# Patient Record
Sex: Female | Born: 1975 | Race: White | Hispanic: No | Smoking: Former smoker
Health system: Southern US, Community
[De-identification: ages and names within clinical notes are randomized; demographics above are authoritative.]

## PROBLEM LIST (undated history)

## (undated) DIAGNOSIS — K219 Gastro-esophageal reflux disease without esophagitis: Secondary | ICD-10-CM

## (undated) DIAGNOSIS — F419 Anxiety disorder, unspecified: Secondary | ICD-10-CM

## (undated) HISTORY — DX: Anxiety disorder, unspecified: F41.9

## (undated) HISTORY — PX: NO PAST SURGERIES: SHX2092

---

## 2020-06-12 ENCOUNTER — Encounter: Payer: Self-pay | Admitting: Gastroenterology

## 2020-06-29 ENCOUNTER — Ambulatory Visit (INDEPENDENT_AMBULATORY_CARE_PROVIDER_SITE_OTHER): Payer: BC Managed Care – PPO | Admitting: Gastroenterology

## 2020-06-29 ENCOUNTER — Encounter: Payer: Self-pay | Admitting: Gastroenterology

## 2020-06-29 VITALS — BP 112/78 | HR 82 | Ht 64.5 in | Wt 140.0 lb

## 2020-06-29 DIAGNOSIS — Z1212 Encounter for screening for malignant neoplasm of rectum: Secondary | ICD-10-CM | POA: Diagnosis not present

## 2020-06-29 DIAGNOSIS — Z1211 Encounter for screening for malignant neoplasm of colon: Secondary | ICD-10-CM | POA: Diagnosis not present

## 2020-06-29 DIAGNOSIS — R1011 Right upper quadrant pain: Secondary | ICD-10-CM

## 2020-06-29 DIAGNOSIS — R160 Hepatomegaly, not elsewhere classified: Secondary | ICD-10-CM

## 2020-06-29 NOTE — Progress Notes (Addendum)
06/29/2020 Victoria Madden [id number] Jul 17, 1975   HISTORY OF PRESENT ILLNESS: This is a 45 year old female who is new to our office.  She was referred here by Dr. Nyra Capes, her OB/GYN for evaluation of a liver lesion.  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  They stated that this is likely benign but recommended to follow-up with contrast-enhanced MRI.  She tells me that her liver enzymes were checked and were normal.  She tells me that she has been experiencing the RUQ abdominal pain since December.  She says that she constantly has a sensation that there is something in the right upper quadrant, but the pain itself is intermittent.  She says that at most it reaches about a 3 or 4 out of 10 on the pain scale.  She says that she experiences that degree every couple of days.  When it is present it seems to last most of the day.  It does radiate to her right back.  Cannot really identify any triggers.  She has never had colonoscopy in the past.  She denies any rectal bleeding.  She says that she moves her bowels regularly without issues.  No family history of colon cancer.   Past Medical History:  Diagnosis Date  . Anxiety    History reviewed. No pertinent surgical history.  reports that she has quit smoking. She has never used smokeless tobacco. She reports previous alcohol use. She reports that she does not use drugs. family history includes Breast cancer in her sister; Colon polyps in her father; Diabetes in her father; Heart disease in her father, maternal grandfather, and maternal grandmother. No Known Allergies    Outpatient Encounter Medications as of 06/29/2020  Medication Sig  . citalopram (CELEXA) 20 MG tablet Take 20 mg by mouth daily.  Marland Kitchen esomeprazole (NEXIUM) 20 MG capsule Take 20 mg by mouth daily at 12 noon.   No facility-administered  encounter medications on file as of 06/29/2020.     REVIEW OF SYSTEMS  : All other systems reviewed and negative except where noted in the History of Present Illness.   PHYSICAL EXAM: BP 112/78   Pulse 82   Ht 5' 4.5" (1.638 m)   Wt 140 lb (63.5 kg)   LMP 06/11/2020   SpO2 97%   BMI 23.66 kg/m  General: Well developed white female in no acute distress Head: Normocephalic and atraumatic Eyes:  Sclerae anicteric, conjunctiva pink. Ears: Normal auditory acuity Lungs: Clear throughout to auscultation; no W/R/R. Heart: Regular rate and rhythm; no M/R/G. Abdomen: Soft, non-distended.  BS present.  Non-tender. Musculoskeletal: Symmetrical with no gross deformities  Skin: No lesions on visible extremities Extremities: No edema  Neurological: Alert oriented x 4, grossly non-focal Psychological:  Alert and cooperative. Normal mood and affect  ASSESSMENT AND PLAN: *Liver lesion: Ultrasound shows a large complex cystic mass measuring 8.6 centimeters in the right hepatic lobe.  This it is likely benign but they recommended follow-up contrast-enhanced MRI.  We will order that.  We will request lab results of LFTs, etc. from her OB/GYN physician *Right upper quadrant abdominal pain: Intermittent since January.  Unsure if that is related to the liver lesion.  She has a constant sensation that something is present in the right upper quadrant, but pain is intermittent.  Other consideration would be biliary dysfunction, which could be evaluated with a HIDA  scan with CCK if needed.  Will hold off for now. *CRC screening:  Patient prefers to start with Cologuard.  Will order that.  She knows that if it is positive then it will require colonoscopy.  **Lab results received.  Labs were from March 2022.  CBC is normal with a hemoglobin of 14.2 g.  CMP is normal with alk phos 76, AST 22, ALT 18, total bilirubin 0.3.  Results sent for scanning.   CC:  Maryella Shivers, MD

## 2020-06-29 NOTE — Patient Instructions (Addendum)
If you are age 46 or older, your body mass index should be between 23-30. Your Body mass index is 23.66 kg/m. If this is out of the aforementioned range listed, please consider follow up with your Primary Care Provider.  If you are age 49 or younger, your body mass index should be between 19-25. Your Body mass index is 23.66 kg/m. If this is out of the aformentioned range listed, please consider follow up with your Primary Care Provider.   You have been scheduled for an MRI at [facility]  on 07/10/20. Your appointment time is 8am. Please arrive 15 minutes prior to your appointment time for registration purposes. Please make certain not to have anything to eat or drink 6 hours prior to your test. In addition, if you have any metal in your body, have a pacemaker or defibrillator, please be sure to let your ordering physician know. This test typically takes 45 minutes to 1 hour to complete. Should you need to reschedule, please call [phone] to do so.  Your provider has ordered Cologuard testing as an option for colon cancer screening. This is performed by [employer] and may be out of network with your insurance. PRIOR to completing the test, it is YOUR responsibility to contact your insurance about covered benefits for this test. Your out of pocket expense could be anywhere from $0.00 to $649.00.   When you call to check coverage with your insurer, please provide the following information:   -The ONLY provider of Cologuard is Opt[facility], dispensing  - CPT code for Cologuard is [phone].  [facility] NPI # [id number]  -[employer] Tax ID # [id number]   We have already sent your demographic and insurance information to [employer] (phone number [phone]) and they should contact you within the next week regarding your test. If you have not heard from them within the next week, please call our office at [phone].  Due to recent changes in healthcare  laws, you may see the results of your imaging and laboratory studies on MyChart before your provider has had a chance to review them.  We understand that in some cases there may be results that are confusing or concerning to you. Not all laboratory results come back in the same time frame and the provider may be waiting for multiple results in order to interpret others.  Please give [place] 48 hours in order for your provider to thoroughly review all the results before contacting the office for clarification of your results.   The [facility] providers would like to encourage you to use [facility] to communicate with providers for non-urgent requests or questions.  Due to long hold times on the telephone, sending your provider a message by [facility] may be a faster and more efficient way to get a response.  Please allow 48  business hours for a response.  Please remember that this is for non-urgent requests.   Thank you for choosing me and [facility].  Doug Sou, PA-C

## 2020-06-29 NOTE — Progress Notes (Signed)
I agree with the above note, plan.  Ultrasound shows a pretty large cyst in the right lobe of her liver.  We are getting an MRI for further evaluation.  She may need referral to hepatobiliary surgeon to consider cyst resection.

## 2020-07-05 ENCOUNTER — Other Ambulatory Visit: Payer: Self-pay

## 2020-07-05 ENCOUNTER — Telehealth: Payer: Self-pay | Admitting: Gastroenterology

## 2020-07-05 DIAGNOSIS — R160 Hepatomegaly, not elsewhere classified: Secondary | ICD-10-CM

## 2020-07-05 DIAGNOSIS — R1901 Right upper quadrant abdominal swelling, mass and lump: Secondary | ICD-10-CM

## 2020-07-05 NOTE — Telephone Encounter (Signed)
Ginger from [facility] states the order for the MRI for Liver should be changed to with and without contrast

## 2020-07-10 ENCOUNTER — Other Ambulatory Visit: Payer: Self-pay

## 2020-07-10 ENCOUNTER — Ambulatory Visit (HOSPITAL_COMMUNITY)
Admission: RE | Admit: 2020-07-10 | Discharge: 2020-07-10 | Disposition: A | Discharge status: Home / Self Care | Payer: BC Managed Care – PPO | Source: Ambulatory Visit | Attending: Gastroenterology | Admitting: Gastroenterology

## 2020-07-10 DIAGNOSIS — R1901 Right upper quadrant abdominal swelling, mass and lump: Secondary | ICD-10-CM | POA: Diagnosis present

## 2020-07-10 DIAGNOSIS — R160 Hepatomegaly, not elsewhere classified: Secondary | ICD-10-CM | POA: Insufficient documentation

## 2020-07-10 MED ORDER — GADOBUTROL 1 MMOL/ML IV SOLN
6.0000 mL | Freq: Once | INTRAVENOUS | Status: AC | PRN
  Administered 2020-07-10: 6 mL via INTRAVENOUS

## 2020-08-22 DIAGNOSIS — K7689 Other specified diseases of liver: Secondary | ICD-10-CM | POA: Diagnosis present

## 2020-11-07 ENCOUNTER — Ambulatory Visit: Payer: Self-pay | Admitting: Surgery

## 2020-11-07 ENCOUNTER — Other Ambulatory Visit: Payer: Self-pay | Admitting: Surgery

## 2021-01-10 DIAGNOSIS — Z8616 Personal history of COVID-19: Secondary | ICD-10-CM

## 2021-01-10 HISTORY — DX: Personal history of COVID-19: Z86.16

## 2021-02-15 NOTE — Progress Notes (Signed)
Surgical Instructions   Your procedure is scheduled on Thursday, 02/21/2021.  Report to [facility] Main Entrance "A" at 05:30 A.M., then check in with the Admitting office.  Call [phone] if you have problems or questions between now and the morning of surgery:   Remember: Do not eat or drink after midnight the night before your surgery  Take these medicines the morning of surgery with A SIP OF WATER:  Citalopram (Celexa) Esomeprazole (Nexium)   As of today, STOP taking any Aspirin (unless otherwise instructed by your surgeon) or Aspirin-containing products; NSAIDS - Aleve, Naproxen, Ibuprofen, Motrin, Advil, Goody's, BC's, all herbal medications, fish oil, and all vitamins.   After your pre-procedure COVID test  You are not required to quarantine however you are required to wear a well-fitting mask when you are out and around people not in your household.  If your mask becomes wet or soiled, replace with a new one.  Wash your hands often with soap and water for 20 seconds or clean your hands with an alcohol-based hand sanitizer that contains at least 60% alcohol.  Do not share personal items.  Notify your provider: if you are in close contact with someone who has COVID  or if you develop a fever of 100.4 or greater, sneezing, cough, sore throat, shortness of breath or body aches.          The day of surgery: Do not wear jewelry or makeup  Do not wear lotions, powders, perfumes, or deodorant.  Do not shave 48 hours prior to surgery.  Do not wear nail polish, gel polish, artificial nails, or any other type of covering on natural nails including fingernails and toenails. If patients have artificial nails, gel coating, etc. that need to be removed by a nail salon please have this removed prior to surgery or surgery may need to be canceled/delayed if the surgeon/ anesthesia feels like the patient is unable to be adequately monitored.  Do not bring valuables to the hospital -  [facility] is not responsible for any belongings or valuables.  Do NOT Smoke (Tobacco/Vaping) or drink Alcohol 24 hours prior to your procedure  If you use a CPAP at night, please bring your mask for your overnight stay.   Contacts, glasses, hearing aids, dentures or partials may not be worn into surgery, please bring cases for these belongings   For patients admitted to the hospital, discharge time will be determined by your treatment team.   Patients discharged the day of surgery will not be allowed to drive home, and someone needs to stay with them for 24 hours.  NO VISITORS WILL BE ALLOWED IN PRE-OP WHERE PATIENTS ARE PREPPED FOR SURGERY.  ONLY 1 SUPPORT PERSON MAY BE PRESENT IN THE WAITING ROOM WHILE YOU ARE IN SURGERY.  IF YOU ARE TO BE ADMITTED, ONCE YOU ARE IN YOUR ROOM YOU WILL BE ALLOWED TWO (2) VISITORS. 1 (ONE) VISITOR MAY STAY OVERNIGHT BUT MUST ARRIVE TO THE ROOM BY 8pm.  Minor children may have two parents present. Special consideration for safety and communication needs will be reviewed on a case by case basis.  Special instructions:    Oral Hygiene is also important to reduce your risk of infection.  Remember - BRUSH YOUR TEETH THE MORNING OF SURGERY WITH YOUR REGULAR TOOTHPASTE   Victoria Madden- Preparing For Surgery  Before surgery, you can play an important role. Because skin is not sterile, your skin needs to be as free of germs as possible. You  can reduce the number of germs on your skin by washing with CHG (chlorahexidine gluconate) Soap before surgery.  CHG is an antiseptic cleaner which kills germs and bonds with the skin to continue killing germs even after washing.     Please do not use if you have an allergy to CHG or antibacterial soaps. If your skin becomes reddened/irritated stop using the CHG.  Do not shave (including legs and underarms) for at least 48 hours prior to first CHG shower. It is OK to shave your face.  Please follow these instructions  carefully.     Shower the NIGHT BEFORE SURGERY and the MORNING OF SURGERY with CHG Soap.   If you chose to wash your hair, wash your hair first as usual with your normal shampoo. After you shampoo, rinse your hair and body thoroughly to remove the shampoo.    Then [employer] and genitals (private parts) with your normal soap and rinse thoroughly to remove soap.  Next use the CHG Soap as you would any other liquid soap. You can apply CHG directly to the skin and wash gently with a clean washcloth.   Apply the CHG Soap to your body ONLY FROM THE NECK DOWN.  Do not use on open wounds or open sores. Avoid contact with your eyes, ears, mouth and genitals (private parts). Wash Face and genitals (private parts)  with your normal soap.   Wash thoroughly, paying special attention to the area where your surgery will be performed.  Thoroughly rinse your body with warm water from the neck down.  DO NOT shower/wash with your normal soap after using and rinsing off the CHG Soap.  Pat yourself dry with a CLEAN TOWEL.  Wear CLEAN PAJAMAS to bed the night before surgery  Place CLEAN SHEETS on your bed the night before your surgery  DO NOT SLEEP WITH PETS.   Day of Surgery:  Take a shower with CHG soap. Wear Clean/Comfortable clothing the morning of surgery Do not apply any deodorants/lotions.   Remember to brush your teeth WITH YOUR REGULAR TOOTHPASTE.   Please read over the fact sheets that you were given.

## 2021-02-18 ENCOUNTER — Encounter (HOSPITAL_COMMUNITY): Payer: Self-pay

## 2021-02-18 ENCOUNTER — Encounter (HOSPITAL_COMMUNITY)
Admission: RE | Admit: 2021-02-18 | Discharge: 2021-02-18 | Disposition: A | Discharge status: Home / Self Care | Payer: BC Managed Care – PPO | Source: Ambulatory Visit | Attending: Surgery | Admitting: Surgery

## 2021-02-18 ENCOUNTER — Other Ambulatory Visit: Payer: Self-pay

## 2021-02-18 VITALS — BP 130/80 | HR 86 | Temp 98.4°F | Resp 17 | Ht 64.5 in | Wt 153.9 lb

## 2021-02-18 DIAGNOSIS — R160 Hepatomegaly, not elsewhere classified: Secondary | ICD-10-CM

## 2021-02-18 DIAGNOSIS — Z20822 Contact with and (suspected) exposure to covid-19: Secondary | ICD-10-CM | POA: Insufficient documentation

## 2021-02-18 DIAGNOSIS — Z01818 Encounter for other preprocedural examination: Secondary | ICD-10-CM

## 2021-02-18 DIAGNOSIS — Z01812 Encounter for preprocedural laboratory examination: Secondary | ICD-10-CM | POA: Diagnosis not present

## 2021-02-18 DIAGNOSIS — K7689 Other specified diseases of liver: Secondary | ICD-10-CM

## 2021-02-18 HISTORY — DX: Gastro-esophageal reflux disease without esophagitis: K21.9

## 2021-02-18 LAB — CBC
HCT: 40.8 % (ref 36.0–46.0)
Hemoglobin: 13.4 g/dL (ref 12.0–15.0)
MCH: 31.2 pg (ref 26.0–34.0)
MCHC: 32.8 g/dL (ref 30.0–36.0)
MCV: 95.1 fL (ref 80.0–100.0)
Platelets: 289 10*3/uL (ref 150–400)
RBC: 4.29 MIL/uL (ref 3.87–5.11)
RDW: 13.5 % (ref 11.5–15.5)
WBC: 5.9 10*3/uL (ref 4.0–10.5)
nRBC: 0 % (ref 0.0–0.2)

## 2021-02-18 LAB — COMPREHENSIVE METABOLIC PANEL
ALT: 17 U/L (ref 0–44)
AST: 20 U/L (ref 15–41)
Albumin: 4.1 g/dL (ref 3.5–5.0)
Alkaline Phosphatase: 58 U/L (ref 38–126)
Anion gap: 5 (ref 5–15)
BUN: 7 mg/dL (ref 6–20)
CO2: 26 mmol/L (ref 22–32)
Calcium: 8.8 mg/dL — ABNORMAL LOW (ref 8.9–10.3)
Chloride: 106 mmol/L (ref 98–111)
Creatinine, Ser: 0.81 mg/dL (ref 0.44–1.00)
GFR, Estimated: >60 mL/min (ref >60)
Glucose, Bld: 98 mg/dL (ref 70–99)
Potassium: 3.9 mmol/L (ref 3.5–5.1)
Sodium: 137 mmol/L (ref 135–145)
Total Bilirubin: 0.6 mg/dL (ref 0.3–1.2)
Total Protein: 6.9 g/dL (ref 6.5–8.1)

## 2021-02-18 LAB — PREPARE RBC (CROSSMATCH)

## 2021-02-18 NOTE — Progress Notes (Signed)
PCP - Dr. Maryella Shivers Cardiologist - patient denies  PPM/ICD - n/a Device Orders -  Rep Notified -   Chest x-ray - n/a EKG - n/a Stress Test - patient denies ECHO - patient denies Cardiac Cath - patient denies  Sleep Study - patient denies CPAP -   Fasting Blood Sugar - n/a Checks Blood Sugar _____ times a day  Blood Thinner Instructions: n/a Aspirin Instructions: n/a  ERAS Protcol - npo after midnight PRE-SURGERY Ensure or G2-   COVID TEST- Patient reported that she tested positive on home test the first 2 weeks in December (cannot tell me exact date unless she looked at her work calendar to see when she missed work).  Patient stated she did not have to be treated nor did she have to see her PCP for symptoms.  Patient denies any symptoms today at her PAT appointment.  It was communicated with Dr. Zenia Resides that patient reported positive test in beginning to mid December, but currently symptom free.   Anesthesia review: n/a   All instructions explained to the patient, with a verbal understanding of the material. Patient agrees to go over the instructions while at home for a better understanding. Patient also instructed to wear a mask in public after being tested for COVID-19. The opportunity to ask questions was provided.

## 2021-02-18 NOTE — Progress Notes (Signed)
Patient is scheduled for laparoscopic fenestration of hepatic cyst this week on 02/22/20. I discussed the case with RN in PAT. Patient has reported at PAT clinic that she had a positive at-home COVID test in mid-December. She did not require any hospitalization and there is no documentation of this test result available. Since there is no documentation, we will proceed with preop COVID testing and if positive, I am agreeable to proceeding with surgery this week as scheduled as long as the patient remains asymptomatic, since it has been >14 days since her reported initial positive test.

## 2021-02-19 LAB — SARS CORONAVIRUS 2 (TAT 6-24 HRS): SARS Coronavirus 2: NEGATIVE

## 2021-02-20 NOTE — Anesthesia Preprocedure Evaluation (Addendum)
Anesthesia Evaluation  Patient identified by MRN, date of birth, ID band Patient awake    Reviewed: Allergy & Precautions, NPO status , Patient's Chart, lab work & pertinent test results  Airway Mallampati: II  TM Distance: >3 FB Neck ROM: Full    Dental no notable dental hx. (+) Dental Advisory Given   Pulmonary Current Smoker and Patient abstained from smoking.,    Pulmonary exam normal        Cardiovascular negative cardio ROS Normal cardiovascular exam     Neuro/Psych negative neurological ROS     GI/Hepatic Neg liver ROS, GERD  ,  Endo/Other  negative endocrine ROS  Renal/GU negative Renal ROS     Musculoskeletal negative musculoskeletal ROS (+)   Abdominal   Peds  Hematology negative hematology ROS (+)   Anesthesia Other Findings   Reproductive/Obstetrics                            Anesthesia Physical Anesthesia Plan  ASA: 2  Anesthesia Plan: General   Post-op Pain Management: Celebrex PO (pre-op) and Tylenol PO (pre-op)   Induction: Intravenous  PONV Risk Score and Plan: 4 or greater and Ondansetron, Dexamethasone, Midazolam and Scopolamine patch - Pre-op  Airway Management Planned: Oral ETT  Additional Equipment:   Intra-op Plan:   Post-operative Plan: Extubation in OR  Informed Consent: I have reviewed the patients History and Physical, chart, labs and discussed the procedure including the risks, benefits and alternatives for the proposed anesthesia with the patient or authorized representative who has indicated his/her understanding and acceptance.     Dental advisory given  Plan Discussed with: Anesthesiologist and CRNA  Anesthesia Plan Comments:        Anesthesia Quick Evaluation

## 2021-02-21 ENCOUNTER — Encounter (HOSPITAL_COMMUNITY)
Admission: RE | Disposition: A | Discharge status: Home / Self Care | Payer: Self-pay | Source: Home / Self Care | Attending: Surgery

## 2021-02-21 ENCOUNTER — Ambulatory Visit (HOSPITAL_COMMUNITY): Payer: BC Managed Care – PPO | Admitting: Anesthesiology

## 2021-02-21 ENCOUNTER — Observation Stay (HOSPITAL_COMMUNITY)
Admission: RE | Admit: 2021-02-21 | Discharge: 2021-02-22 | Disposition: A | Discharge status: Home / Self Care | Payer: BC Managed Care – PPO | Attending: Surgery | Admitting: Surgery

## 2021-02-21 ENCOUNTER — Ambulatory Visit (HOSPITAL_COMMUNITY): Payer: BC Managed Care – PPO | Admitting: Vascular Surgery

## 2021-02-21 ENCOUNTER — Encounter (HOSPITAL_COMMUNITY): Payer: Self-pay | Admitting: Surgery

## 2021-02-21 DIAGNOSIS — F17210 Nicotine dependence, cigarettes, uncomplicated: Secondary | ICD-10-CM | POA: Diagnosis not present

## 2021-02-21 DIAGNOSIS — K7689 Other specified diseases of liver: Principal | ICD-10-CM | POA: Diagnosis present

## 2021-02-21 DIAGNOSIS — R160 Hepatomegaly, not elsewhere classified: Secondary | ICD-10-CM

## 2021-02-21 DIAGNOSIS — Z8616 Personal history of COVID-19: Secondary | ICD-10-CM | POA: Insufficient documentation

## 2021-02-21 HISTORY — PX: LAPAROSCOPIC HEPATECTOMY: SHX5897

## 2021-02-21 LAB — ABO/RH: ABO/RH(D): O POS

## 2021-02-21 LAB — POCT PREGNANCY, URINE: Preg Test, Ur: NEGATIVE

## 2021-02-21 SURGERY — HEPATECTOMY, LAPAROSCOPIC
Anesthesia: General

## 2021-02-21 MED ORDER — LACTATED RINGERS IV SOLN: INTRAVENOUS | Status: DC | PRN

## 2021-02-21 MED ORDER — LIDOCAINE 2% (20 MG/ML) 5 ML SYRINGE
INTRAMUSCULAR | Status: AC
  Filled 2021-02-21: qty 5

## 2021-02-21 MED ORDER — PHENYLEPHRINE HCL-NACL 20-0.9 MG/250ML-% IV SOLN
INTRAVENOUS | Status: DC | PRN
  Administered 2021-02-21: 20 ug/min via INTRAVENOUS

## 2021-02-21 MED ORDER — AMISULPRIDE (ANTIEMETIC) 5 MG/2ML IV SOLN: 10.0000 mg | Freq: Once | INTRAVENOUS | Status: DC | PRN

## 2021-02-21 MED ORDER — DOCUSATE SODIUM 100 MG PO CAPS
100.0000 mg | ORAL_CAPSULE | Freq: Two times a day (BID) | ORAL | Status: DC
  Administered 2021-02-21 – 2021-02-22 (×2): 100 mg via ORAL
  Filled 2021-02-21 (×2): qty 1

## 2021-02-21 MED ORDER — ROCURONIUM BROMIDE 10 MG/ML (PF) SYRINGE
PREFILLED_SYRINGE | INTRAVENOUS | Status: AC
  Filled 2021-02-21: qty 10

## 2021-02-21 MED ORDER — ENOXAPARIN SODIUM 40 MG/0.4ML IJ SOSY
40.0000 mg | PREFILLED_SYRINGE | INTRAMUSCULAR | Status: DC
  Administered 2021-02-21: 40 mg via SUBCUTANEOUS
  Filled 2021-02-21: qty 0.4

## 2021-02-21 MED ORDER — PROPOFOL 10 MG/ML IV BOLUS
INTRAVENOUS | Status: AC
  Filled 2021-02-21: qty 20

## 2021-02-21 MED ORDER — CITALOPRAM HYDROBROMIDE 20 MG PO TABS
20.0000 mg | ORAL_TABLET | Freq: Every day | ORAL | Status: DC
  Administered 2021-02-22: 20 mg via ORAL
  Filled 2021-02-21: qty 1

## 2021-02-21 MED ORDER — BUPIVACAINE-EPINEPHRINE (PF) 0.25% -1:200000 IJ SOLN
INTRAMUSCULAR | Status: AC
  Filled 2021-02-21: qty 30

## 2021-02-21 MED ORDER — DIPHENHYDRAMINE HCL 25 MG PO CAPS: 25.0000 mg | ORAL_CAPSULE | Freq: Four times a day (QID) | ORAL | Status: DC | PRN

## 2021-02-21 MED ORDER — CHLORHEXIDINE GLUCONATE 0.12 % MT SOLN
15.0000 mL | Freq: Once | OROMUCOSAL | Status: AC
  Administered 2021-02-21: 15 mL via OROMUCOSAL
  Filled 2021-02-21: qty 15

## 2021-02-21 MED ORDER — PROPOFOL 10 MG/ML IV BOLUS
INTRAVENOUS | Status: DC | PRN
Start: 2021-02-21 — End: 2021-02-21
  Administered 2021-02-21: 170 mg via INTRAVENOUS

## 2021-02-21 MED ORDER — PANTOPRAZOLE SODIUM 40 MG PO TBEC
40.0000 mg | DELAYED_RELEASE_TABLET | Freq: Every day | ORAL | Status: DC
  Administered 2021-02-22: 40 mg via ORAL
  Filled 2021-02-21: qty 1

## 2021-02-21 MED ORDER — CEFAZOLIN SODIUM-DEXTROSE 2-4 GM/100ML-% IV SOLN
2.0000 g | INTRAVENOUS | Status: AC
  Administered 2021-02-21: 2 g via INTRAVENOUS
  Filled 2021-02-21: qty 100

## 2021-02-21 MED ORDER — ACETAMINOPHEN 500 MG PO TABS
1000.0000 mg | ORAL_TABLET | Freq: Three times a day (TID) | ORAL | Status: DC
  Administered 2021-02-21 – 2021-02-22 (×3): 1000 mg via ORAL
  Filled 2021-02-21 (×3): qty 2

## 2021-02-21 MED ORDER — ONDANSETRON 4 MG PO TBDP: 4.0000 mg | ORAL_TABLET | Freq: Four times a day (QID) | ORAL | Status: DC | PRN

## 2021-02-21 MED ORDER — PROMETHAZINE HCL 25 MG/ML IJ SOLN: 6.2500 mg | INTRAMUSCULAR | Status: DC | PRN

## 2021-02-21 MED ORDER — LIDOCAINE HCL 1 % IJ SOLN
INTRAMUSCULAR | Status: AC
  Filled 2021-02-21: qty 20

## 2021-02-21 MED ORDER — SUGAMMADEX SODIUM 200 MG/2ML IV SOLN
INTRAVENOUS | Status: DC | PRN
Start: 2021-02-21 — End: 2021-02-21
  Administered 2021-02-21: 400 mg via INTRAVENOUS

## 2021-02-21 MED ORDER — FENTANYL CITRATE (PF) 100 MCG/2ML IJ SOLN: 25.0000 ug | INTRAMUSCULAR | Status: DC | PRN

## 2021-02-21 MED ORDER — METHOCARBAMOL 500 MG PO TABS
500.0000 mg | ORAL_TABLET | Freq: Four times a day (QID) | ORAL | Status: DC
  Administered 2021-02-21 – 2021-02-22 (×3): 500 mg via ORAL
  Filled 2021-02-21 (×3): qty 1

## 2021-02-21 MED ORDER — CELECOXIB 200 MG PO CAPS
200.0000 mg | ORAL_CAPSULE | Freq: Once | ORAL | Status: AC
  Administered 2021-02-21: 200 mg via ORAL
  Filled 2021-02-21: qty 1

## 2021-02-21 MED ORDER — ONDANSETRON HCL 4 MG/2ML IJ SOLN: 4.0000 mg | Freq: Four times a day (QID) | INTRAMUSCULAR | Status: DC | PRN

## 2021-02-21 MED ORDER — TRAMADOL HCL 50 MG PO TABS
ORAL_TABLET | ORAL | Status: AC
  Filled 2021-02-21: qty 1

## 2021-02-21 MED ORDER — ONDANSETRON HCL 4 MG/2ML IJ SOLN
INTRAMUSCULAR | Status: AC
  Filled 2021-02-21: qty 2

## 2021-02-21 MED ORDER — SCOPOLAMINE 1 MG/3DAYS TD PT72
1.0000 | MEDICATED_PATCH | TRANSDERMAL | Status: DC
  Administered 2021-02-21: 1.5 mg via TRANSDERMAL
  Filled 2021-02-21: qty 1

## 2021-02-21 MED ORDER — FENTANYL CITRATE (PF) 250 MCG/5ML IJ SOLN
INTRAMUSCULAR | Status: AC
  Filled 2021-02-21: qty 5

## 2021-02-21 MED ORDER — SODIUM CHLORIDE 0.9 % IR SOLN
Status: DC | PRN
  Administered 2021-02-21: 1000 mL

## 2021-02-21 MED ORDER — ACETAMINOPHEN 500 MG PO TABS: 1000.0000 mg | ORAL_TABLET | Freq: Once | ORAL | Status: AC

## 2021-02-21 MED ORDER — GABAPENTIN 300 MG PO CAPS
300.0000 mg | ORAL_CAPSULE | ORAL | Status: AC
  Administered 2021-02-21: 300 mg via ORAL
  Filled 2021-02-21: qty 1

## 2021-02-21 MED ORDER — 0.9 % SODIUM CHLORIDE (POUR BTL) OPTIME
TOPICAL | Status: DC | PRN
  Administered 2021-02-21 (×2): 1000 mL

## 2021-02-21 MED ORDER — DEXAMETHASONE SODIUM PHOSPHATE 10 MG/ML IJ SOLN
INTRAMUSCULAR | Status: AC
  Filled 2021-02-21: qty 1

## 2021-02-21 MED ORDER — BUPIVACAINE-EPINEPHRINE (PF) 0.25% -1:200000 IJ SOLN
INTRAMUSCULAR | Status: DC | PRN
  Administered 2021-02-21: 30 mL

## 2021-02-21 MED ORDER — ROCURONIUM BROMIDE 10 MG/ML (PF) SYRINGE
PREFILLED_SYRINGE | INTRAVENOUS | Status: DC | PRN
  Administered 2021-02-21: 20 mg via INTRAVENOUS
  Administered 2021-02-21: 30 mg via INTRAVENOUS
  Administered 2021-02-21: 70 mg via INTRAVENOUS

## 2021-02-21 MED ORDER — TRAMADOL HCL 50 MG PO TABS
50.0000 mg | ORAL_TABLET | Freq: Four times a day (QID) | ORAL | Status: DC | PRN
  Administered 2021-02-21: 50 mg via ORAL

## 2021-02-21 MED ORDER — ACETAMINOPHEN 500 MG PO TABS
1000.0000 mg | ORAL_TABLET | ORAL | Status: AC
  Administered 2021-02-21: 1000 mg via ORAL
  Filled 2021-02-21: qty 2

## 2021-02-21 MED ORDER — MIDAZOLAM HCL 2 MG/2ML IJ SOLN
INTRAMUSCULAR | Status: DC | PRN
  Administered 2021-02-21: 2 mg via INTRAVENOUS

## 2021-02-21 MED ORDER — DEXMEDETOMIDINE (PRECEDEX) IN NS 20 MCG/5ML (4 MCG/ML) IV SYRINGE
PREFILLED_SYRINGE | INTRAVENOUS | Status: DC | PRN
Start: 2021-02-21 — End: 2021-02-21
  Administered 2021-02-21: 8 ug via INTRAVENOUS
  Administered 2021-02-21: 4 ug via INTRAVENOUS

## 2021-02-21 MED ORDER — LIDOCAINE 2% (20 MG/ML) 5 ML SYRINGE
INTRAMUSCULAR | Status: DC | PRN
  Administered 2021-02-21: 100 mg via INTRAVENOUS

## 2021-02-21 MED ORDER — MIDAZOLAM HCL 2 MG/2ML IJ SOLN
INTRAMUSCULAR | Status: AC
  Filled 2021-02-21: qty 2

## 2021-02-21 MED ORDER — KETOROLAC TROMETHAMINE 15 MG/ML IJ SOLN
15.0000 mg | Freq: Three times a day (TID) | INTRAMUSCULAR | Status: DC
  Administered 2021-02-21 – 2021-02-22 (×3): 15 mg via INTRAVENOUS
  Filled 2021-02-21 (×3): qty 1

## 2021-02-21 MED ORDER — DIPHENHYDRAMINE HCL 50 MG/ML IJ SOLN: 25.0000 mg | Freq: Four times a day (QID) | INTRAMUSCULAR | Status: DC | PRN

## 2021-02-21 MED ORDER — FENTANYL CITRATE (PF) 250 MCG/5ML IJ SOLN
INTRAMUSCULAR | Status: DC | PRN
Start: 2021-02-21 — End: 2021-02-21
  Administered 2021-02-21 (×3): 50 ug via INTRAVENOUS
  Administered 2021-02-21: 100 ug via INTRAVENOUS

## 2021-02-21 MED ORDER — PHENYLEPHRINE 40 MCG/ML (10ML) SYRINGE FOR IV PUSH (FOR BLOOD PRESSURE SUPPORT)
PREFILLED_SYRINGE | INTRAVENOUS | Status: AC
  Filled 2021-02-21: qty 10

## 2021-02-21 MED ORDER — LACTATED RINGERS IV SOLN: INTRAVENOUS | Status: DC

## 2021-02-21 MED ORDER — PHENYLEPHRINE 40 MCG/ML (10ML) SYRINGE FOR IV PUSH (FOR BLOOD PRESSURE SUPPORT)
PREFILLED_SYRINGE | INTRAVENOUS | Status: DC | PRN
  Administered 2021-02-21: 40 ug via INTRAVENOUS

## 2021-02-21 MED ORDER — ORAL CARE MOUTH RINSE: 15.0000 mL | Freq: Once | OROMUCOSAL | Status: AC

## 2021-02-21 MED ORDER — ONDANSETRON HCL 4 MG/2ML IJ SOLN
INTRAMUSCULAR | Status: DC | PRN
  Administered 2021-02-21: 4 mg via INTRAVENOUS

## 2021-02-21 MED ORDER — DEXAMETHASONE SODIUM PHOSPHATE 10 MG/ML IJ SOLN
INTRAMUSCULAR | Status: DC | PRN
Start: 2021-02-21 — End: 2021-02-21
  Administered 2021-02-21: 10 mg via INTRAVENOUS

## 2021-02-21 SURGICAL SUPPLY — 91 items
APPLICATOR VISTASEAL 35 (MISCELLANEOUS) IMPLANT
BAG COUNTER SPONGE SURGICOUNT (BAG) ×2 IMPLANT
BLADE CLIPPER SURG (BLADE) IMPLANT
CANISTER SUCT 3000ML PPV (MISCELLANEOUS) ×1 IMPLANT
CHLORAPREP W/TINT 26 (MISCELLANEOUS) ×2 IMPLANT
CLIP LIGATING HEMO O LOK GREEN (MISCELLANEOUS) ×2 IMPLANT
CLIP VESOCCLUDE LG 6/CT (CLIP) ×1 IMPLANT
CLIP VESOCCLUDE MED 6/CT (CLIP) ×1 IMPLANT
CLIP VESOCCLUDE SM WIDE 24/CT (CLIP) ×1 IMPLANT
CLIP VESOLOCK LG 6/CT PURPLE (CLIP) ×1 IMPLANT
CLIP VESOLOCK MED 6/CT (CLIP) ×1 IMPLANT
COVER SURGICAL LIGHT HANDLE (MISCELLANEOUS) ×2 IMPLANT
DERMABOND ADVANCED (GAUZE/BANDAGES/DRESSINGS) ×1
DERMABOND ADVANCED .7 DNX12 (GAUZE/BANDAGES/DRESSINGS) IMPLANT
DOPPLER CAUTERY SUPPRESSOR (INSTRUMENTS) ×1 IMPLANT
DRAIN CHANNEL 19F RND (DRAIN) IMPLANT
DRAIN PENROSE 0.5X18 (DRAIN) IMPLANT
DRAPE INCISE IOBAN 66X45 STRL (DRAPES) ×1 IMPLANT
DRAPE WARM FLUID 44X44 (DRAPES) ×3 IMPLANT
DRSG COVADERM 4X10 (GAUZE/BANDAGES/DRESSINGS) IMPLANT
DRSG COVADERM 4X14 (GAUZE/BANDAGES/DRESSINGS) IMPLANT
ELECT BLADE 6.5 EXT (BLADE) IMPLANT
ELECT CAUTERY BLADE 6.4 (BLADE) ×1 IMPLANT
ELECT PAD DSPR THERM+ ADLT (MISCELLANEOUS) ×2 IMPLANT
ELECT REM PT RETURN 9FT ADLT (ELECTROSURGICAL) ×2
ELECTRODE REM PT RTRN 9FT ADLT (ELECTROSURGICAL) ×1 IMPLANT
EVACUATOR SILICONE 100CC (DRAIN) IMPLANT
GLOVE SURG ENC MOIS LTX SZ6 (GLOVE) ×2 IMPLANT
GLOVE SURG UNDER LTX SZ6.5 (GLOVE) ×2 IMPLANT
GOWN STRL REUS W/ TWL LRG LVL3 (GOWN DISPOSABLE) ×3 IMPLANT
GOWN STRL REUS W/TWL 2XL LVL3 (GOWN DISPOSABLE) ×2 IMPLANT
GOWN STRL REUS W/TWL LRG LVL3 (GOWN DISPOSABLE) ×3
HEMOSTAT SNOW SURGICEL 2X4 (HEMOSTASIS) IMPLANT
KIT BASIN OR (CUSTOM PROCEDURE TRAY) ×2 IMPLANT
KIT TURNOVER KIT B (KITS) ×2 IMPLANT
L-HOOK LAP DISP 36CM (ELECTROSURGICAL) ×2
LHOOK LAP DISP 36CM (ELECTROSURGICAL) ×1 IMPLANT
LOOP VESSEL MAXI BLUE (MISCELLANEOUS) IMPLANT
NS IRRIG 1000ML POUR BTL (IV SOLUTION) ×4 IMPLANT
PAD ARMBOARD 7.5X6 YLW CONV (MISCELLANEOUS) ×4 IMPLANT
PENCIL BUTTON HOLSTER BLD 10FT (ELECTRODE) ×2 IMPLANT
POUCH LAPAROSCOPIC INSTRUMENT (MISCELLANEOUS) ×1 IMPLANT
POUCH SPECIMEN RETRIEVAL 10MM (ENDOMECHANICALS) ×1 IMPLANT
PROBE LASER ARGON (MISCELLANEOUS) ×2 IMPLANT
RELOAD STAPLE 60 2.6 WHT THN (STAPLE) IMPLANT
RELOAD STAPLE 60 3.6 BLU REG (STAPLE) IMPLANT
RELOAD STAPLER BLUE 60MM (STAPLE) IMPLANT
RELOAD STAPLER WHITE 60MM (STAPLE) IMPLANT
SCISSORS HARMONIC WAVE 18CM (INSTRUMENTS) ×1 IMPLANT
SET CANNULATION TOURNIQUET (MISCELLANEOUS) ×1 IMPLANT
SET IRRIG TUBING LAPAROSCOPIC (IRRIGATION / IRRIGATOR) ×1 IMPLANT
SET TUBE SMOKE EVAC HIGH FLOW (TUBING) ×2 IMPLANT
SHEARS HARMONIC ACE PLUS 36CM (ENDOMECHANICALS) ×1 IMPLANT
SHEARS HARMONIC HDI 36CM (ELECTROSURGICAL) IMPLANT
SLEEVE ENDOPATH XCEL 5M (ENDOMECHANICALS) ×4 IMPLANT
SPONGE T-LAP 18X18 ~~LOC~~+RFID (SPONGE) IMPLANT
STAPLE ECHEON FLEX 60 POW ENDO (STAPLE) IMPLANT
STAPLER RELOAD BLUE 60MM (STAPLE)
STAPLER RELOAD WHITE 60MM (STAPLE)
STAPLER VISISTAT 35W (STAPLE) ×1 IMPLANT
SUT ETHILON 1 LR 30 (SUTURE) ×1 IMPLANT
SUT ETHILON 2 0 FS 18 (SUTURE) IMPLANT
SUT MNCRL AB 4-0 PS2 18 (SUTURE) ×2 IMPLANT
SUT PDS AB 1 TP1 96 (SUTURE) IMPLANT
SUT PDS II 0 TP-1 LOOPED 60 (SUTURE) IMPLANT
SUT PROLENE 3 0 SH DA (SUTURE) ×2 IMPLANT
SUT PROLENE 4 0 RB 1 (SUTURE)
SUT PROLENE 4-0 RB1 18X2 ARM (SUTURE) ×1 IMPLANT
SUT VIC AB 2-0 CT1 27 (SUTURE)
SUT VIC AB 2-0 CT1 TAPERPNT 27 (SUTURE) ×1 IMPLANT
SUT VIC AB 2-0 SH 18 (SUTURE) ×1 IMPLANT
SUT VIC AB 3-0 SH 18 (SUTURE) ×1 IMPLANT
SUT VIC AB 3-0 SH 27 (SUTURE) ×1
SUT VIC AB 3-0 SH 27X BRD (SUTURE) IMPLANT
SUT VICRYL 0 AB UR-6 (SUTURE) ×1 IMPLANT
SUT VICRYL AB 2 0 TIES (SUTURE) ×1 IMPLANT
SUT VICRYL AB 3 0 TIES (SUTURE) ×1 IMPLANT
SYS LAPSCP GELPORT 120MM (MISCELLANEOUS)
SYSTEM LAPSCP GELPORT 120MM (MISCELLANEOUS) IMPLANT
TOWEL GREEN STERILE (TOWEL DISPOSABLE) ×2 IMPLANT
TOWEL GREEN STERILE FF (TOWEL DISPOSABLE) ×2 IMPLANT
TRAY FOLEY MTR SLVR 16FR STAT (SET/KITS/TRAYS/PACK) ×1 IMPLANT
TRAY LAPAROSCOPIC MC (CUSTOM PROCEDURE TRAY) ×2 IMPLANT
TROCAR XCEL 12X100 BLDLESS (ENDOMECHANICALS) IMPLANT
TROCAR XCEL BLUNT TIP 100MML (ENDOMECHANICALS) ×1 IMPLANT
TROCAR XCEL NON-BLD 5MMX100MML (ENDOMECHANICALS) ×2 IMPLANT
TUBE CONNECTING 12X1/4 (SUCTIONS) ×1 IMPLANT
TUBING EVAC SMOKE HEATED PNEUM (TUBING) ×1 IMPLANT
WARMER LAPAROSCOPE (MISCELLANEOUS) ×2 IMPLANT
WATER STERILE IRR 1000ML POUR (IV SOLUTION) ×1 IMPLANT
YANKAUER SUCT BULB TIP NO VENT (SUCTIONS) IMPLANT

## 2021-02-21 NOTE — Transfer of Care (Signed)
Immediate Anesthesia Transfer of Care Note  Patient: Victoria Madden  Procedure(s) Performed: LAPAROSCOPIC FENSTRATION OF HEPATIC CYST  Patient Location: PACU  Anesthesia Type:General  Level of Consciousness: drowsy  Airway & Oxygen Therapy: Patient Spontanous Breathing  Post-op Assessment: Report given to RN and Post -op Vital signs reviewed and stable  Post vital signs: Reviewed and stable  Last Vitals:  Vitals Value Taken Time  BP 146/65 02/21/21 0931  Temp    Pulse 86 02/21/21 0931  Resp 11 02/21/21 0931  SpO2 99 % 02/21/21 0931  Vitals shown include unvalidated device data.  Last Pain:  Vitals:   02/21/21 0554  TempSrc:   PainSc: 0-No pain      Patients Stated Pain Goal: 0 (02/21/21 0554)  Complications: No notable events documented.

## 2021-02-21 NOTE — Anesthesia Procedure Notes (Signed)
Procedure Name: Intubation Date/Time: 02/21/2021 7:37 AM Performed by: Vonna Drafts, CRNA Pre-anesthesia Checklist: Patient identified, Emergency Drugs available, Suction available and Patient being monitored Patient Re-evaluated:Patient Re-evaluated prior to induction Oxygen Delivery Method: Circle system utilized Preoxygenation: Pre-oxygenation with 100% oxygen Induction Type: IV induction Ventilation: Mask ventilation without difficulty Laryngoscope Size: Mac and 3 Grade View: Grade I Tube type: Oral Tube size: 7.0 mm Number of attempts: 1 Airway Equipment and Method: Stylet and Oral airway Placement Confirmation: ETT inserted through vocal cords under direct vision, positive ETCO2 and breath sounds checked- equal and bilateral Secured at: 21 cm Tube secured with: Tape Dental Injury: Teeth and Oropharynx as per pre-operative assessment

## 2021-02-21 NOTE — Anesthesia Postprocedure Evaluation (Signed)
Anesthesia Post Note  Patient: Victoria Madden  Procedure(s) Performed: LAPAROSCOPIC FENSTRATION OF HEPATIC CYST     Patient location during evaluation: PACU Anesthesia Type: General Level of consciousness: sedated Pain management: pain level controlled Vital Signs Assessment: post-procedure vital signs reviewed and stable Respiratory status: spontaneous breathing and respiratory function stable Cardiovascular status: stable Postop Assessment: no apparent nausea or vomiting Anesthetic complications: no   No notable events documented.  Last Vitals:  Vitals:   02/21/21 1015 02/21/21 1030  BP: 123/74 116/76  Pulse: 69 66  Resp: 19 16  Temp:  36.4 C  SpO2: 98% 97%    Last Pain:  Vitals:   02/21/21 1030  TempSrc:   PainSc: Asleep                 Ermie Glendenning DANIEL

## 2021-02-21 NOTE — Op Note (Signed)
Date: 02/21/21  Patient: Victoria Madden MRN: [id number]  Preoperative Diagnosis: Hepatic cyst Postoperative Diagnosis: Same  Procedure: Laparoscopic fenestration of hepatic cyst  Surgeon: Sophronia Simas, MD Assistant: Almond Lint, MD  EBL: Minimal  Anesthesia: General endotracheal  Specimens: Liver cyst wall  Indications: [name] is a 46 yo female who presented with RUQ pain radiation to the right flank and back. Imaging workup showed a large simple cyst in the right posterior liver.  Findings: Simple cyst in the right posterior liver containing serous fluid and no septations.  Procedure details: Informed consent was obtained in the preoperative area prior to the procedure. The patient was brought to the operating room, general anesthesia was induced and appropriate lines and drains were placed for intraoperative monitoring. Perioperative antibiotics were administered per SCIP guidelines. The patient was placed in the left lateral decubitus position and the abdomen and right flank were prepped and draped in the usual sterile fashion. A pre-procedure timeout was taken verifying patient identity, surgical site and procedure to be performed.  A small supraumbilical skin incision was made, the subcutaneous tissue was divided with cautery to expose the fascia, and the fascia was grasped and elevated.  The fascia was incised and the peritoneal cavity was directly visualized, and a 12 mm Hassan trocar was inserted.  The abdomen was insufflated and visually inspected, and there was no evidence of visceral or vascular injury.  Additional 5 mm ports were placed in the right upper quadrant, right subcostal margin and right flank, all under direct visualization. The right triangular hepatic ligament was taken down with the Harmonic.  The right hepatic lobe was then retracted anteriorly to expose the cyst.  The cyst had thin filmy attachments to the retroperitoneum, and these were taken down with harmonic.   Once the cyst was fully mobilized, the wall was opened using the harmonic.  A large amount of serous fluid was suctioned out of the cyst.  The cyst wall was then unroofed using the harmonic.  There was a very thin rim of liver tissue around the edges of the cyst, and care was taken to avoid taking a lot of liver parenchyma.  The excised pieces of the cyst wall were sent for routine pathology.  The lining of the cyst was then thoroughly ablated using argon plasma coagulation.  Hemostasis was achieved at the edges of the cyst using argon.  The surgical site was then irrigated and appeared hemostatic.  The supraumbilical port was removed and closed with a 0 Vicryl pursestring suture.  The remaining ports were removed and the abdomen was desufflated.  The skin at all port sites was closed with subcuticular 4-0 Monocryl suture, and Dermabond was applied.  The patient tolerated the procedure with no apparent complications.  All counts were correct x2 at the end of the procedure. The patient was extubated and taken to PACU in stable condition.  Sophronia Simas, MD 02/21/21 9:51 AM

## 2021-02-21 NOTE — H&P (Signed)
Victoria Madden is an 46 y.o. female.   Chief Complaint: abdominal pain HPI: Victoria Madden is a 46 yo female who presented with right flank and back pain. She had a RUQ [place] followed by an MRI, both of which showed a large cyst in the right posterior liver. LFTs were normal. She has decided to proceed with laparoscopic fenestration of the cyst and presents today for surgery.  She had a positive COVID home test about a month ago, her symptoms were mild and have resolved. Her preop COVID PCR test was negative.  Past Medical History:  Diagnosis Date   Anxiety    GERD (gastroesophageal reflux disease)    History of COVID-19 01/2021   patient states she tested positive on home test    Past Surgical History:  Procedure Laterality Date   NO PAST SURGERIES      Family History  Problem Relation Age of Onset   Colon polyps Father    Diabetes Father    Heart disease Father    Breast cancer Sister    Heart disease Maternal Grandmother    Heart disease Maternal Grandfather    Colon cancer Neg Hx    Pancreatic cancer Neg Hx    Esophageal cancer Neg Hx    Liver cancer Neg Hx    Stomach cancer Neg Hx    Social History:  reports that she has been smoking cigarettes. She has never used smokeless tobacco. She reports that she does not currently use alcohol. She reports that she does not use drugs.  Allergies: No Known Allergies  Medications Prior to Admission  Medication Sig Dispense Refill   Ascorbic Acid (VITAMIN C PO) Take by mouth.     Cholecalciferol (VITAMIN D3 PO) Take 1 tablet by mouth daily.     citalopram (CELEXA) 20 MG tablet Take 20 mg by mouth daily.     esomeprazole (NEXIUM) 20 MG capsule Take 20 mg by mouth daily at 12 noon.     Lysine 500 MG CAPS Take 500 mg by mouth daily.     Multiple Vitamins-Minerals (ZINC PO) Take 1 tablet by mouth daily.     Probiotic Product (PROBIOTIC DAILY PO) Take 1 tablet by mouth daily.      Results for orders placed or performed during the hospital  encounter of 02/21/21 (from the past 48 hour(s))  Pregnancy, urine POC     Status: None   Collection Time: 02/21/21  6:07 AM  Result Value Ref Range   Preg Test, Ur NEGATIVE NEGATIVE    Comment:        THE SENSITIVITY OF THIS METHODOLOGY IS >24 mIU/mL   ABO/Rh     Status: None (Preliminary result)   Collection Time: 02/21/21  7:00 AM  Result Value Ref Range   ABO/RH(D) PENDING    No results found.  Review of Systems  Blood pressure (!) 145/84, pulse 79, temperature 98.4 F (36.9 C), temperature source Oral, resp. rate 18, height 5' 4.5" (1.638 m), weight 68.9 kg, last menstrual period 02/11/2021, SpO2 99 %.  Physical Exam Vitals reviewed.  Constitutional:      General: She is not in acute distress.    Appearance: Normal appearance.  Eyes:     General: No scleral icterus.    Conjunctiva/sclera: Conjunctivae normal.  Pulmonary:     Effort: Pulmonary effort is normal. No respiratory distress.  Abdominal:     General: There is no distension.     Palpations: Abdomen is soft.  Tenderness: There is no abdominal tenderness.     Comments: No organomegaly, no surgical scars.  Musculoskeletal:        General: No swelling. Normal range of motion.     Cervical back: Normal range of motion.  Skin:    General: Skin is warm and dry.     Coloration: Skin is not jaundiced.  Neurological:     General: No focal deficit present.     Mental Status: She is alert and oriented to person, place, and time.  Psychiatric:        Mood and Affect: Mood normal.        Behavior: Behavior normal.        Thought Content: Thought content normal.     Assessment/Plan 46 yo female with a large symptomatic liver cyst. Proceed to OR for laparoscopic fenestration. Informed consent obtained. Admit for overnight observation postoperatively.  Fritzi Mandes, MD 02/21/2021, 7:26 AM

## 2021-02-21 NOTE — Discharge Instructions (Addendum)
[facility] DISCHARGE INSTRUCTIONS  Activity No heavy lifting greater than 15 pounds for 4 weeks after surgery. Ok to shower in 24 hours after surgery, but do not bathe or submerge incisions underwater. Do not drive while taking narcotic pain medication.  Wound Care Your incisions are covered with skin glue called Dermabond. This will peel off on its own over time. You may shower and allow warm soapy water to run over your incisions. Gently pat dry. Do not submerge your incision underwater. Monitor your incision for any new redness, tenderness, or drainage.  When to Call us: Fever greater than 100.5 New redness, drainage, or swelling at incision site Severe pain, nausea, or vomiting Jaundice (yellowing of the whites of the eyes or skin)  Follow-up You have an appointment scheduled with Dr. Freida Busman on March 11, 2021 at 9:30am. This will be at the [facility] office at [address], [place], [place]. Please arrive at least 15 minutes prior to your scheduled appointment time.  For questions or concerns, please call the office at [phone].

## 2021-02-22 ENCOUNTER — Encounter (HOSPITAL_COMMUNITY): Payer: Self-pay | Admitting: Surgery

## 2021-02-22 DIAGNOSIS — K7689 Other specified diseases of liver: Secondary | ICD-10-CM | POA: Diagnosis not present

## 2021-02-22 LAB — SURGICAL PATHOLOGY

## 2021-02-22 MED ORDER — DOCUSATE SODIUM 100 MG PO CAPS: 100.0000 mg | ORAL_CAPSULE | Freq: Two times a day (BID) | ORAL | 0 refills | Status: AC

## 2021-02-22 MED ORDER — METHOCARBAMOL 500 MG PO TABS: 500.0000 mg | ORAL_TABLET | Freq: Four times a day (QID) | ORAL | 0 refills | Status: DC

## 2021-02-22 MED ORDER — ACETAMINOPHEN 500 MG PO TABS: 1000.0000 mg | ORAL_TABLET | Freq: Three times a day (TID) | ORAL | 0 refills | Status: DC

## 2021-02-22 MED ORDER — TRAMADOL HCL 50 MG PO TABS: 50.0000 mg | ORAL_TABLET | Freq: Four times a day (QID) | ORAL | 0 refills | Status: AC | PRN

## 2021-02-22 NOTE — Plan of Care (Signed)

## 2021-02-22 NOTE — Discharge Summary (Signed)
Physician Discharge Summary   Patient ID: Victoria Madden [id number] 45 y.o. [birth year]  Admit date: 02/21/2021  Discharge date and time: 02/22/2021  Admitting Physician: Dwan Bolt, MD   Discharge Physician: Michaelle Birks, MD  Admission Diagnoses: Hepatic cyst [K76.89]  Discharge Diagnoses: Hepatic cyst  Admission Condition: good  Discharged Condition: good  Indication for Admission: Victoria Madden is a 46 yo female with a large symptomatic benign hepatic cyst. She elected to under laparoscopic fenestration and presented for surgery on 1/12.  Hospital Course: The patient was taken to the OR on 02/22/20 for laparoscopic fenestration of right hepatic cyst. For further details of this procedure please see separately dictated operative report. Postoperatively she was admitted to the med-surg floor in stable condition. She was advanced to a regular diet, which she tolerated without nausea or vomiting. On the morning of POD1, she was tolerating a diet and pain was controlled on oral medications. Vitals were within normal limits with no signs/symptoms of bleeding. She was examined and deemed appropriate for discharge home.  Consults: None  Significant Diagnostic Studies: None  Treatments: surgery: Laparoscopic fenestration of hepatic cyst  Discharge Exam: General: resting comfortably, appears stated age, no apparent distress Neurological: alert and oriented, no focal deficits, cranial nerves grossly in tact HEENT: normocephalic, atraumatic, oropharynx clear, no scleral icterus CV: extremities warm and well-perfused Respiratory: normal work of breathing on room air Abdomen: soft, nondistended, nontender to deep palpation. Lap incisions clean and dry with no erythema or induration. Extremities: warm and well-perfused, no deformities, moving all extremities spontaneously Psychiatric: normal mood and affect Skin: warm and dry, no jaundice, no rashes or lesions   Disposition: Discharge  disposition: 01-Home or Self Care       Patient Instructions:  Allergies as of 02/22/2021   No Known Allergies      Medication List     TAKE these medications    acetaminophen 500 MG tablet Commonly known as: TYLENOL Take 2 tablets (1,000 mg total) by mouth 3 (three) times daily.   citalopram 20 MG tablet Commonly known as: CELEXA Take 20 mg by mouth daily.   docusate sodium 100 MG capsule Commonly known as: COLACE Take 1 capsule (100 mg total) by mouth 2 (two) times daily for 14 days.   esomeprazole 20 MG capsule Commonly known as: NEXIUM Take 20 mg by mouth daily at 12 noon.   Lysine 500 MG Caps Take 500 mg by mouth daily.   methocarbamol 500 MG tablet Commonly known as: ROBAXIN Take 1 tablet (500 mg total) by mouth 4 (four) times daily.   PROBIOTIC DAILY PO Take 1 tablet by mouth daily.   traMADol 50 MG tablet Commonly known as: ULTRAM Take 1 tablet (50 mg total) by mouth every 6 (six) hours as needed for up to 5 days for severe pain.   VITAMIN C PO Take by mouth.   VITAMIN D3 PO Take 1 tablet by mouth daily.   ZINC PO Take 1 tablet by mouth daily.       Activity: no heavy lifting for 4 weeks and do not drive while taking narcotic pain medication Diet: regular diet Wound Care: keep wound clean and dry and ok to shower but do not bathe for 2 weeks  Follow-up with Dr. Zenia Resides on March 11, 2021 at 9:30am.  Signed: Dwan Bolt 02/22/2021 6:39 AM

## 2021-02-25 LAB — TYPE AND SCREEN
ABO/RH(D): O POS
Antibody Screen: NEGATIVE
Unit division: 0
Unit division: 0

## 2021-02-25 LAB — BPAM RBC
Blood Product Expiration Date: 202302032359
Blood Product Expiration Date: 202302032359
ISSUE DATE / TIME: 202301100927
Unit Type and Rh: 5100
Unit Type and Rh: 5100

## 2022-07-31 ENCOUNTER — Other Ambulatory Visit: Payer: Self-pay

## 2022-07-31 ENCOUNTER — Emergency Department (HOSPITAL_COMMUNITY)
Admission: EM | Admit: 2022-07-31 | Discharge: 2022-07-31 | Disposition: A | Discharge status: Home / Self Care | Payer: BC Managed Care – PPO | Attending: Emergency Medicine | Admitting: Emergency Medicine

## 2022-07-31 DIAGNOSIS — D649 Anemia, unspecified: Secondary | ICD-10-CM | POA: Diagnosis not present

## 2022-07-31 DIAGNOSIS — M549 Dorsalgia, unspecified: Secondary | ICD-10-CM | POA: Diagnosis present

## 2022-07-31 DIAGNOSIS — R300 Dysuria: Secondary | ICD-10-CM | POA: Diagnosis not present

## 2022-07-31 DIAGNOSIS — R7401 Elevation of levels of liver transaminase levels: Secondary | ICD-10-CM | POA: Diagnosis not present

## 2022-07-31 DIAGNOSIS — N12 Tubulo-interstitial nephritis, not specified as acute or chronic: Secondary | ICD-10-CM | POA: Diagnosis not present

## 2022-07-31 LAB — COMPREHENSIVE METABOLIC PANEL
ALT: 49 U/L — ABNORMAL HIGH (ref 0–44)
AST: 28 U/L (ref 15–41)
Albumin: 2.9 g/dL — ABNORMAL LOW (ref 3.5–5.0)
Alkaline Phosphatase: 268 U/L — ABNORMAL HIGH (ref 38–126)
Anion gap: 11 (ref 5–15)
BUN: 13 mg/dL (ref 6–20)
CO2: 25 mmol/L (ref 22–32)
Calcium: 8.7 mg/dL — ABNORMAL LOW (ref 8.9–10.3)
Chloride: 101 mmol/L (ref 98–111)
Creatinine, Ser: 0.93 mg/dL (ref 0.44–1.00)
GFR, Estimated: >60 mL/min (ref >60)
Glucose, Bld: 113 mg/dL — ABNORMAL HIGH (ref 70–99)
Potassium: 4.1 mmol/L (ref 3.5–5.1)
Sodium: 137 mmol/L (ref 135–145)
Total Bilirubin: 0.5 mg/dL (ref 0.3–1.2)
Total Protein: 7.7 g/dL (ref 6.5–8.1)

## 2022-07-31 LAB — URINALYSIS, ROUTINE W REFLEX MICROSCOPIC
Bacteria, UA: NONE SEEN
Bilirubin Urine: NEGATIVE
Glucose, UA: NEGATIVE mg/dL
Ketones, ur: NEGATIVE mg/dL
Nitrite: NEGATIVE
Protein, ur: 30 mg/dL — AB
Specific Gravity, Urine: 1.008 (ref 1.005–1.030)
WBC, UA: >50 WBC/hpf (ref 0–5)
pH: 6 (ref 5.0–8.0)

## 2022-07-31 LAB — CBC
HCT: 32 % — ABNORMAL LOW (ref 36.0–46.0)
Hemoglobin: 10.3 g/dL — ABNORMAL LOW (ref 12.0–15.0)
MCH: 27.4 pg (ref 26.0–34.0)
MCHC: 32.2 g/dL (ref 30.0–36.0)
MCV: 85.1 fL (ref 80.0–100.0)
Platelets: 626 10*3/uL — ABNORMAL HIGH (ref 150–400)
RBC: 3.76 MIL/uL — ABNORMAL LOW (ref 3.87–5.11)
RDW: 16.1 % — ABNORMAL HIGH (ref 11.5–15.5)
WBC: 10.8 10*3/uL — ABNORMAL HIGH (ref 4.0–10.5)
nRBC: 0 % (ref 0.0–0.2)

## 2022-07-31 LAB — PREGNANCY, URINE: Preg Test, Ur: NEGATIVE

## 2022-07-31 MED ORDER — CEFDINIR 300 MG PO CAPS
300.0000 mg | ORAL_CAPSULE | Freq: Once | ORAL | Status: AC
  Administered 2022-07-31: 300 mg via ORAL
  Filled 2022-07-31: qty 1

## 2022-07-31 MED ORDER — CEFDINIR 300 MG PO CAPS: 300.0000 mg | ORAL_CAPSULE | Freq: Two times a day (BID) | ORAL | 0 refills | Status: DC

## 2022-07-31 MED ORDER — ONDANSETRON 8 MG PO TBDP: 8.0000 mg | ORAL_TABLET | Freq: Three times a day (TID) | ORAL | 0 refills | Status: DC | PRN

## 2022-07-31 NOTE — Discharge Instructions (Signed)
Take the antibiotics as prescribed.  Follow-up with your doctor to be rechecked to make sure the infection has resolved and to recheck your liver test.  Return to the ER for high fevers, inability to keep down medications

## 2022-07-31 NOTE — ED Triage Notes (Signed)
Pt arrives with reports of intermittent shaking, hot and cold flashes. Pt reports two weeks ago she stopped drinking alcohol and stopped smoking mariajuana after she had been using both daily for the last several months. Pt reports muscle aches and constant nausea. Pt reports going to dr office and being told her potassium was low and liver enzymes were low.

## 2022-07-31 NOTE — ED Notes (Signed)
Discharge and follow up reviewed with pt, pt verbalized understanding
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
## 2022-08-21 ENCOUNTER — Other Ambulatory Visit: Payer: Self-pay | Admitting: Family Medicine

## 2022-08-21 DIAGNOSIS — Z1231 Encounter for screening mammogram for malignant neoplasm of breast: Secondary | ICD-10-CM

## 2022-08-27 ENCOUNTER — Inpatient Hospital Stay: Admission: RE | Admit: 2022-08-27 | Payer: BC Managed Care – PPO | Source: Ambulatory Visit

## 2022-12-18 IMAGING — MR MR ABDOMEN WO/W CM
18 series · 48 of 48 positions shown · IV contrast (6ml GADAVIST)
Comparison: Right upper quadrant ultrasound, 05/10/2020

CLINICAL DATA: Complex hepatic cyst identified by ultrasound

EXAM:
MRI ABDOMEN WITHOUT AND WITH CONTRAST
TECHNIQUE: Multiplanar multisequence MR imaging of the abdomen was performed
both before and after the administration of intravenous contrast.
CONTRAST:  6 mL Gadavist gadolinium contrast IV

[Series 4: cor haste · coronal · 6.0mm · 1.56mm/px · 2 of 29 slices shown]
[im 1/29]
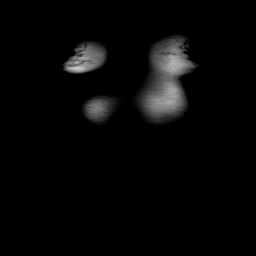
[im 29/29]
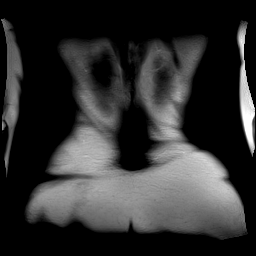

[Series 5: bSSFP · axial · 6.0mm · 0.86mm/px · z∈[-91,+143]mm · 2 of 40 slices shown]
[im 1/40]
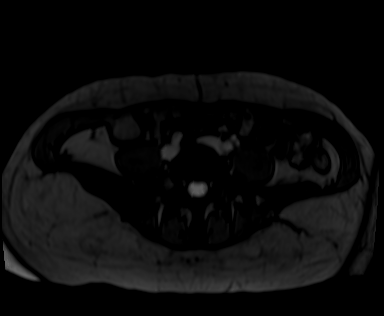
[im 40/40]
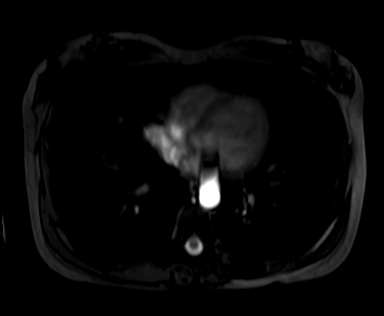

[Series 6: T2 fat-sat · axial · 6.0mm · 1.19mm/px · z∈[-108,+158]mm · 2 of 38 slices shown]
[im 1/38]
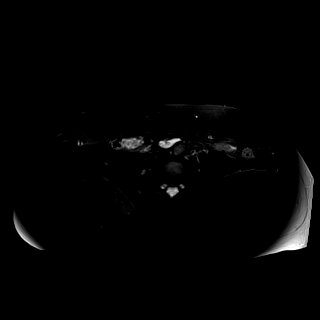
[im 38/38]
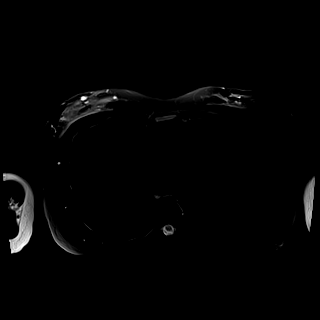

[Series 8: DWI · axial · 6.0mm · 1.36mm/px · z∈[-133,+184]mm · 4 of 90 slices shown (1 of 2)]
[im 1/90]
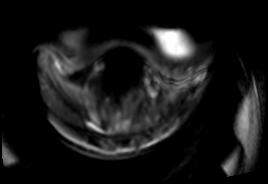
[im 30/90]
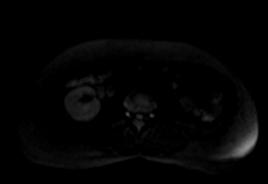
[im 60/90]
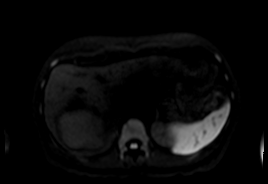
[im 90/90]
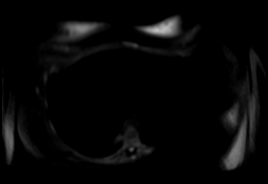

[Series 9: DWI · axial · 6.0mm · 1.36mm/px · z∈[-133,+184]mm · 2 of 45 slices shown (2 of 2)]
[im 1/45]
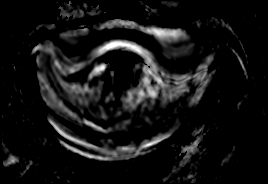
[im 45/45]
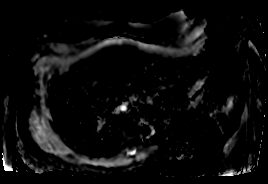

[Series 10: T1 dynamic · axial · 3.0mm · 1.14mm/px · z∈[-122,+163]mm · 3 of 96 slices shown (1 of 9)]
[im 1/96]
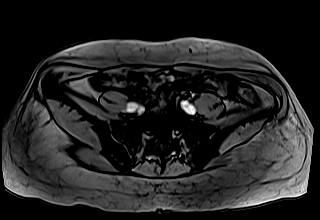
[im 48/96]
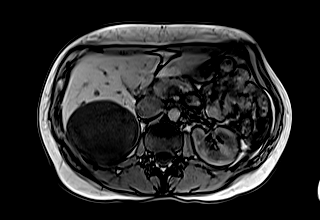
[im 96/96]
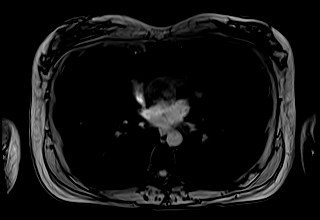

[Series 11: T1 dynamic · axial · 3.0mm · 1.14mm/px · z∈[-122,+163]mm · 3 of 96 slices shown (2 of 9)]
[im 1/96]
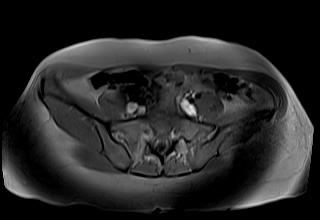
[im 48/96]
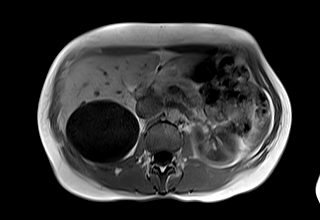
[im 96/96]
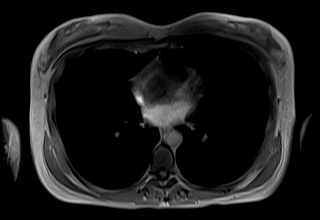

[Series 18: T1 dynamic · axial · 3.0mm · 1.14mm/px · z∈[-122,+163]mm · 3 of 96 slices shown (3 of 9)]
[im 1/96]
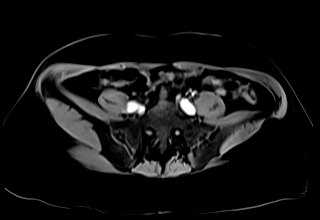
[im 48/96]
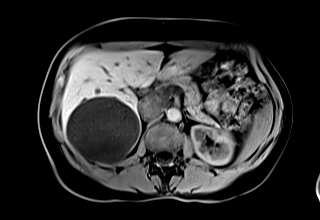
[im 96/96]
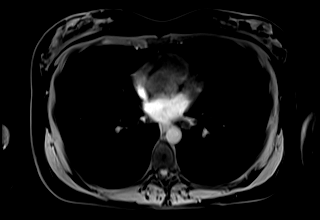

[Series 20: T1 dynamic · axial · 3.0mm · 1.14mm/px · z∈[-122,+163]mm · 3 of 96 slices shown (4 of 9)]
[im 1/96]
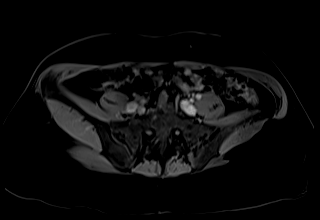
[im 48/96]
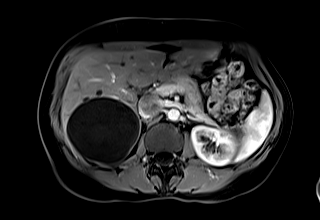
[im 96/96]
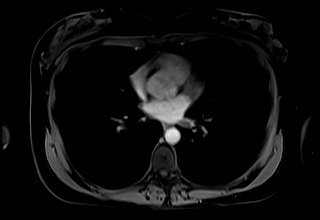

[Series 21: T1 dynamic · axial · 3.0mm · 1.14mm/px · z∈[-122,+163]mm · 3 of 96 slices shown (5 of 9)]
[im 1/96]
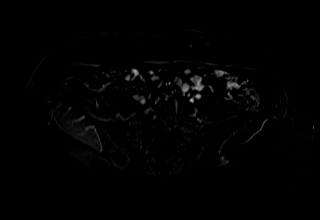
[im 48/96]
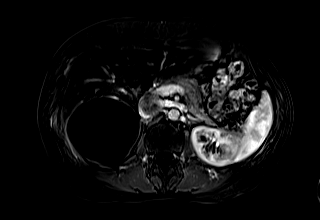
[im 96/96]
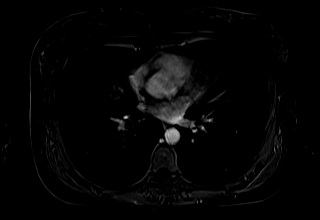

[Series 23: T1 dynamic · axial · 3.0mm · 1.14mm/px · z∈[-122,+163]mm · 3 of 96 slices shown (6 of 9)]
[im 1/96]
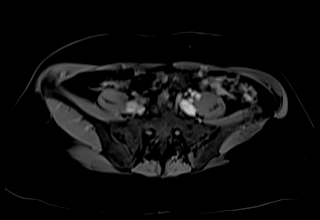
[im 48/96]
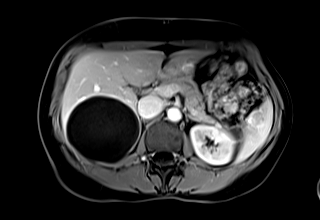
[im 96/96]
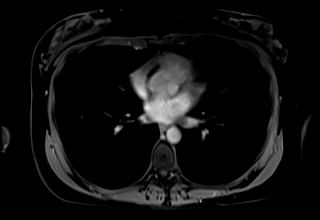

[Series 24: T1 dynamic · axial · 3.0mm · 1.14mm/px · z∈[-122,+163]mm · 3 of 96 slices shown (7 of 9)]
[im 1/96]
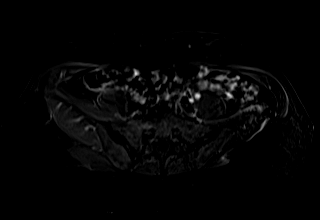
[im 48/96]
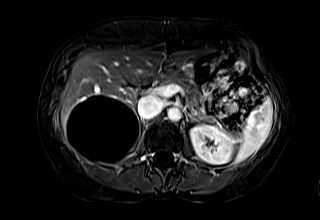
[im 96/96]
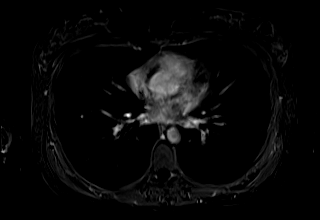

[Series 26: T1 dynamic · axial · 3.0mm · 1.14mm/px · z∈[-122,+163]mm · 3 of 96 slices shown (8 of 9)]
[im 1/96]
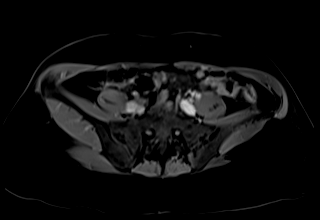
[im 48/96]
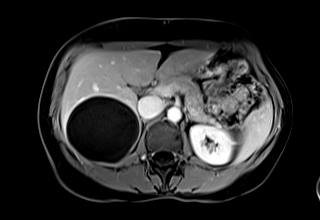
[im 96/96]
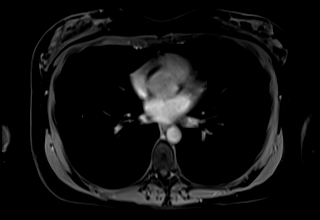

[Series 27: T1 dynamic · axial · 3.0mm · 1.14mm/px · z∈[-122,+163]mm · 3 of 96 slices shown (9 of 9)]
[im 1/96]
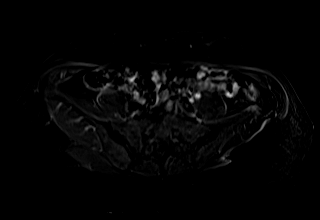
[im 48/96]
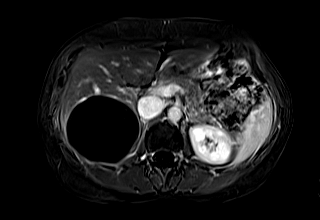
[im 96/96]
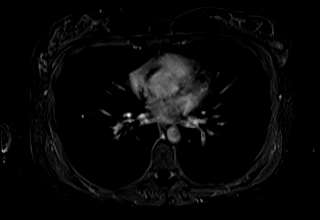

[Series 28: ax_haste_mbh · axial · 6.0mm · 1.14mm/px · 1 of 37 slices shown]
[im 1/37]
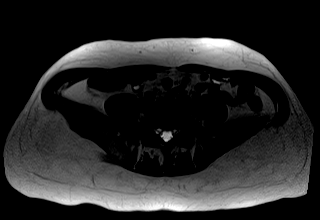

[Series 30: cor_vibe_dixon_delayed_w · coronal · 4.0mm · 1.76mm/px · 2 of 60 slices shown]
[im 1/60]
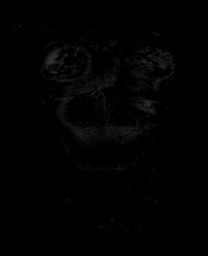
[im 60/60]
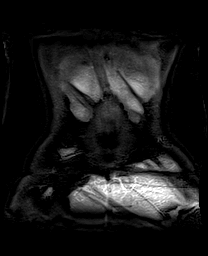

[Series 32: ax_dixon_delayed_w_reg · axial · 3.0mm · 1.14mm/px · z∈[-122,+163]mm · 3 of 96 slices shown]
[im 1/96]
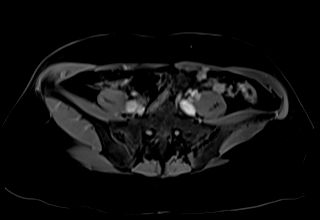
[im 48/96]
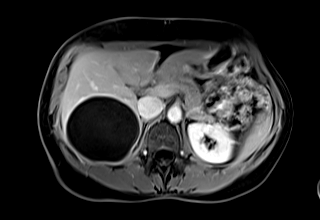
[im 96/96]
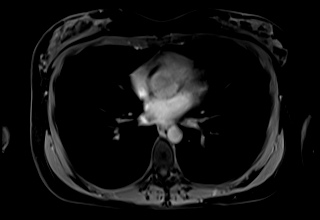

[Series 33: ax_dixon_delayed_w_reg_sub · axial · 3.0mm · 1.14mm/px · z∈[-122,+163]mm · 3 of 96 slices shown]
[im 1/96]
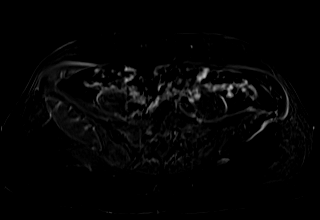
[im 48/96]
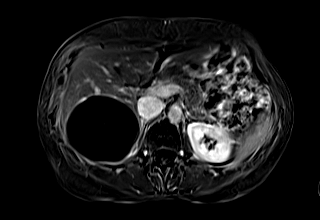
[im 96/96]
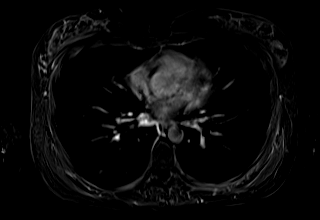

[48 of 48 positions shown; findings below may reference images not displayed]

FINDINGS: Lower chest: No acute findings.

Hepatobiliary: There is a large subcapsular cyst of the posterior
right lobe of the liver, hepatic segments VI/VII, measuring 8.3 x
7.8 cm (series 20, image 48). There may be very fine internal
septation not well appreciated by MR, without associated contrast
enhancement. No solid mass or other parenchymal abnormality
identified. No biliary ductal dilatation.

Pancreas: No mass, inflammatory changes, or other parenchymal
abnormality identified.

Spleen:  Within normal limits in size and appearance.

Adrenals/Urinary Tract: No masses identified. The right kidney is
displaced inferiorly by adjacent liver cyst. No evidence of
hydronephrosis.

Stomach/Bowel: Visualized portions within the abdomen are
unremarkable.

Vascular/Lymphatic: No pathologically enlarged lymph nodes
identified. No abdominal aortic aneurysm demonstrated.

Other:  None.

Musculoskeletal: No suspicious bone lesions identified.
IMPRESSION: There is a large subcapsular cyst of the posterior right lobe of the
liver, hepatic segments VI/VII, measuring 8.3 x 7.8 cm. There may be
very fine internal septation not well appreciated by MR, without
associated contrast enhancement. This is definitively benign,
although possibly symptomatic due to large size and mass effect on
the right kidney. No solid mass or other parenchymal abnormality
identified.

## 2023-12-16 ENCOUNTER — Ambulatory Visit (HOSPITAL_BASED_OUTPATIENT_CLINIC_OR_DEPARTMENT_OTHER): Payer: Self-pay | Admitting: Family Medicine

## 2023-12-16 ENCOUNTER — Encounter (HOSPITAL_BASED_OUTPATIENT_CLINIC_OR_DEPARTMENT_OTHER): Payer: Self-pay | Admitting: Family Medicine

## 2023-12-16 VITALS — BP 134/87 | HR 69 | Ht 64.5 in | Wt 166.0 lb

## 2023-12-16 DIAGNOSIS — Z8742 Personal history of other diseases of the female genital tract: Secondary | ICD-10-CM | POA: Insufficient documentation

## 2023-12-16 DIAGNOSIS — M546 Pain in thoracic spine: Secondary | ICD-10-CM | POA: Diagnosis not present

## 2023-12-16 DIAGNOSIS — K219 Gastro-esophageal reflux disease without esophagitis: Secondary | ICD-10-CM | POA: Diagnosis not present

## 2023-12-16 DIAGNOSIS — G8929 Other chronic pain: Secondary | ICD-10-CM | POA: Insufficient documentation

## 2023-12-16 DIAGNOSIS — F419 Anxiety disorder, unspecified: Secondary | ICD-10-CM | POA: Insufficient documentation

## 2023-12-16 DIAGNOSIS — Z1211 Encounter for screening for malignant neoplasm of colon: Secondary | ICD-10-CM

## 2023-12-16 DIAGNOSIS — Z7689 Persons encountering health services in other specified circumstances: Secondary | ICD-10-CM

## 2023-12-16 DIAGNOSIS — Z79890 Hormone replacement therapy (postmenopausal): Secondary | ICD-10-CM | POA: Insufficient documentation

## 2023-12-16 DIAGNOSIS — E038 Other specified hypothyroidism: Secondary | ICD-10-CM | POA: Diagnosis not present

## 2023-12-16 NOTE — Assessment & Plan Note (Signed)
 Hx of Abnormal Pap and Colposcopy per patient.

## 2023-12-16 NOTE — Progress Notes (Signed)
 New Patient Office Visit  Subjective:   Victoria Madden [birth year] 12/16/2023  Chief Complaint  Patient presents with   New Patient (Initial Visit)    Patient is here today to get established with the practice. Denies any main concerns for today's visit.    Discussed the use of AI scribe software for clinical note transcription with the patient, who gave verbal consent to proceed.  History of Present Illness Victoria Madden is a 48 year old female who presents for a new patient appointment. She is currently managed by integrative provider Delon Romano, FNP who has referred patient to PCP.   Last PCP: Landry Georgi, MD ([place])  Concerns: See below   GERD:  She also has a history of acid reflux, previously managed with Nexium (esomeprazole), but she is currently trying natural methods to manage it. She has experienced a recurrence of symptoms recently.  ANXIETY:  She has a history of anxiety and stress, previously managed with Celexa  (citalopram ) 20 mg, which she has been off since January. She experienced significant stress and anxiety during a stressful relationship and divorce last year. She is currently working with a Librarian, academic and managing her anxiety with natural supplements like magnesium.  HRT:  She reports hormonal imbalances, with high estrogen and low progesterone levels identified by integrative provider, and reports that treatment has led to some improvement. She is undergoing further blood work to assess her hormone levels and adjust treatment as needed. She is also concerned about weight retention despite exercising and eating well. She has been diagnosed with believed subclinical hypothyroidism (will obtain records to confirm) and is currently taking levothyroxine 25mcg.    Her family history includes her father having colon polyps, diabetes, and heart disease, and her mother having high blood pressure and heart issues. Her sister had breast cancer at age 4  and has a heart rhythm issue. She has a personal history of a breast cyst that is painful during ovulation and menstruation, and she is scheduled for a mammogram.  BACK PAIN:  She has been experiencing a trigger point in her back, which she attributes to stress and physical activity. Reports the pain does radiate from right scapular area to RUQ abdomen.  She has been seeing a chiropractor and massage therapist for this issue, which has improved but still presents with inflammation and tightness. Denies dizziness, SHOB, chest pain. She believes this is more musculoskeletal instead of GI due to previous ABD imaging and lack of symptoms such as nausea, vomiting, belching, etc.     The following portions of the patient's history were reviewed and updated as appropriate: past medical history, past surgical history, family history, social history, allergies, medications, and problem list.   Patient Active Problem List   Diagnosis Date Noted   History of abnormal cervical Pap smear 12/16/2023   Subclinical hypothyroidism 12/16/2023   Chronic right-sided thoracic back pain 12/16/2023   Hormone replacement therapy (HRT) 12/16/2023   GERD (gastroesophageal reflux disease)    Anxiety    Liver cyst 08/22/2020   Screening for colorectal cancer 06/29/2020   RUQ abdominal pain 06/29/2020   Liver mass 06/29/2020   Past Medical History:  Diagnosis Date   Anxiety    GERD (gastroesophageal reflux disease)    History of COVID-19 01/2021   patient states she tested positive on home test   Past Surgical History:  Procedure Laterality Date   LAPAROSCOPIC HEPATECTOMY N/A 02/21/2021   Procedure: LAPAROSCOPIC FENSTRATION OF HEPATIC CYST;  Surgeon: Dasie,  Leonor CROME, MD;  Location: [facility] OR;  Service: General;  Laterality: N/A;   NO PAST SURGERIES     Family History  Problem Relation Age of Onset   Hypertension Mother    Osteopenia Mother    Colon polyps Father    Diabetes Father    Heart disease Father     Breast cancer Sister 3       Negative genetic screening   Heart disease Maternal Grandmother    Heart disease Maternal Grandfather    Breast cancer Maternal Aunt    Colon cancer Neg Hx    Pancreatic cancer Neg Hx    Esophageal cancer Neg Hx    Liver cancer Neg Hx    Stomach cancer Neg Hx    Social History   Socioeconomic History   Marital status: Divorced    Spouse name: Not on file   Number of children: Not on file   Years of education: Not on file   Highest education level: Not on file  Occupational History   Not on file  Tobacco Use   Smoking status: Former    Types: Cigarettes   Smokeless tobacco: Never   Tobacco comments:    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, 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   Vaping status: Never Used  Substance and Sexual Activity   Alcohol use: Not Currently    Comment: ocassionally   Drug use: Never   Sexual activity: Not on file  Other Topics Concern   Not on file  Social History Narrative   Not on file   Social Drivers of Health   Financial Resource Strain: Not on file  Food Insecurity: Not on file  Transportation Needs: Not on file  Physical Activity: Not on file  Stress: Not on file  Social Connections: Not on file  Intimate Partner Violence: Not on file   Outpatient Medications Prior to Visit  Medication Sig Dispense Refill   b complex vitamins capsule Take 1 capsule by mouth daily.     Cholecalciferol (VITAMIN D3 PO) Take 1 tablet by mouth daily.     Ferrous Sulfate (IRON PO) Take by mouth.     levothyroxine (SYNTHROID) 25 MCG tablet Take 25 mcg by mouth every morning.     Lysine 500 MG CAPS Take 500 mg by mouth daily.     MAGNESIUM PO Take by mouth in the morning and at bedtime.     progesterone (PROMETRIUM) 100 MG capsule Take 100 mg by mouth at bedtime.     Multiple Vitamins-Minerals (ZINC PO) Take 1 tablet by mouth daily.     acetaminophen  (TYLENOL ) 500 MG tablet Take 2 tablets (1,000 mg total) by mouth 3 (three) times  daily. 30 tablet 0   Ascorbic Acid (VITAMIN C PO) Take by mouth.     cefdinir  (OMNICEF ) 300 MG capsule Take 1 capsule (300 mg total) by mouth 2 (two) times daily. 14 capsule 0   citalopram  (CELEXA ) 20 MG tablet Take 20 mg by mouth daily.     esomeprazole (NEXIUM) 20 MG capsule Take 20 mg by mouth daily at 12 noon.     methocarbamol  (ROBAXIN ) 500 MG tablet Take 1 tablet (500 mg total) by mouth 4 (four) times daily. 20 tablet 0   ondansetron  (ZOFRAN -ODT) 8 MG disintegrating tablet Take 1 tablet (8 mg total) by mouth every 8 (eight) hours as needed for nausea or vomiting. 12 tablet 0   Probiotic Product (PROBIOTIC DAILY PO) Take 1 tablet by mouth daily.  No facility-administered medications prior to visit.   No Known Allergies  ROS: A complete ROS was performed with pertinent positives/negatives noted in the HPI. The remainder of the ROS are negative.   Objective:   Today's Vitals   12/16/23 1506  BP: 134/87  Pulse: 69  SpO2: 99%  Weight: 166 lb (75.3 kg)  Height: 5' 4.5 (1.638 m)    GENERAL: Well-appearing, in NAD. Well nourished.  SKIN: Pink, warm and dry. No rash, lesion, ulceration, or ecchymoses.  Head: Normocephalic. NECK: Trachea midline. Full ROM w/o pain or tenderness.  RESPIRATORY: Chest wall symmetrical. Respirations even and non-labored.  MSK: Muscle tone and strength appropriate for age. Tight muscle banding present to right scapula and paraspinal thoracic spine area. Full ROM to RUE without deformity, crepitus or weakness.  GI: Abdomen soft, non-tender. Normoactive bowel sounds. No rebound tenderness. No hepatomegaly or splenomegaly. No CVA tenderness.  EXTREMITIES: Without clubbing, cyanosis, or edema.  NEUROLOGIC: No motor or sensory deficits. Steady, even gait. C2-C12 intact.  PSYCH/MENTAL STATUS: Alert, oriented x 3. Cooperative, appropriate mood and affect.    Health Maintenance Due  Topic Date Due   HIV Screening  Never done   Mammogram  Never done    Hepatitis C Screening  Never done   DTaP/Tdap/Td (1 - Tdap) Never done   Hepatitis B Vaccines 19-59 Average Risk (1 of 3 - 19+ 3-dose series) Never done   Cervical Cancer Screening (HPV/Pap Cotest)  Never done   Colonoscopy  Never done      Assessment & Plan:  1. Encounter to establish care with new doctor (Primary) Will obtain records from prior PCP and from functional medicine provider for PCP review of recent lab work.  Discussed role of PCP and reviewed patient's medical, surgical, social and family history.  2. Gastroesophageal reflux disease, unspecified whether esophagitis present Currently uncontrolled per patient.  She is awaiting food sensitivity testing with integrative health for direction regarding GERD management.  3. Anxiety Discussed management of anxiety with CBT and natural supplements.  Discussed if patient is desiring to restart her Celexa  to reach out to PCP.  Safety plan reviewed.  4. Subclinical hypothyroidism Will obtain records from integrative health for recent TSH and monitoring of levothyroxine replacement.  5. Screening for colon cancer - Cologuard  6. Hormone replacement therapy (HRT) Currently managed by integrative health provider with HRT replacement.  Will obtain records for review.  Patient will complete Pap with PCP with upcoming annual exam and is scheduled for her mammogram in November.  7. Chronic right-sided thoracic back pain Discussed benefits of massage, chiropractic adjustment, and possible benefit of dry needling or trigger point therapy.  Discussed signs and symptoms of cholestatic disease as well due to radiation of pain into right upper quadrant.  Patient will monitor and reach out to PCP if desiring further evaluation and imaging if right upper quadrant pain returns.  Patient to reach out to office if new, worrisome, or unresolved symptoms arise or if no improvement in patient's condition. Patient verbalized understanding and is agreeable  to treatment plan. All questions answered to patient's satisfaction.    Return in about 3 months (around 03/17/2024) for ANNUAL PHYSICAL and Pap Smear (Fasting labs at visit if needed) .    Thersia Schuyler Stark, [place]

## 2023-12-16 NOTE — Patient Instructions (Addendum)
 Complete mammogram and have results forwarded to our office (Fax [phone])   We will request records from [facility] and Dr. Trinidad office.   VISIT SUMMARY: Today, you had a new patient appointment and integrative medicine consultation. We discussed your medical history, current symptoms, and family history. We reviewed your current medications and supplements, and we developed a plan to address your health concerns, including acid reflux, anxiety, thyroid function, back pain, ovarian cysts, and general health maintenance.  YOUR PLAN: -GASTROESOPHAGEAL REFLUX DISEASE: Gastroesophageal reflux disease (GERD) is a condition where stomach acid frequently flows back into the tube connecting your mouth and stomach. We will await the results of your food sensitivity test to identify potential dietary triggers and continue with your natural management strategies.  -ANXIETY: Anxiety is a feeling of worry, nervousness, or unease. You are currently managing your symptoms with natural supplements. Continue taking magnesium and start ashwagandha supplementation. If your anxiety worsens, we may consider restarting citalopram .  -SUBCLINICAL HYPOTHYROIDISM: Subclinical hypothyroidism is a mild form of underactive thyroid where the thyroid gland doesn't produce enough hormones. Continue taking levothyroxine 25 mcg and await your upcoming thyroid function tests.  -MUSCULOSKELETAL BACK PAIN WITH MYOFASCIAL TRIGGER POINTS: This type of back pain is caused by tight, sensitive areas in your muscles. Continue with chiropractic care and massage therapy. Consider dry needling for additional relief.   -COLORECTAL CANCER SCREENING: Colorectal cancer screening helps detect cancer early. We have ordered a Cologuard test for you. If the test is positive, we will follow up with a colonoscopy.   INSTRUCTIONS: Await the results of your food sensitivity test and upcoming thyroid function tests. Proceed with your  scheduled mammogram and scan. We will perform a Pap smear during your upcoming physical. Complete the Cologuard test as ordered, and we will follow up with a colonoscopy if needed.

## 2024-01-13 LAB — COLOGUARD: COLOGUARD: NEGATIVE

## 2024-01-14 ENCOUNTER — Ambulatory Visit (HOSPITAL_BASED_OUTPATIENT_CLINIC_OR_DEPARTMENT_OTHER): Payer: Self-pay | Admitting: Family Medicine

## 2024-01-14 NOTE — Progress Notes (Signed)
Your cologuard test is negative for colon cancer from your fecal matter sample.    This is not an all inclusive test and is not as specific as a direct visualization of the colon with a colonoscopy. Cologuard thus may provide a false negative result. It is recommended to have a colonoscopy at least every 10 years or more frequently as indicated by a GI provider. You can repeat your Cologuard test in 3 years. If you would like to have a colonoscopy please call the office at any time and we would be happy to order this for you.

## 2024-03-17 ENCOUNTER — Encounter (HOSPITAL_BASED_OUTPATIENT_CLINIC_OR_DEPARTMENT_OTHER): Admitting: Family Medicine

## 2024-05-16 ENCOUNTER — Encounter (HOSPITAL_BASED_OUTPATIENT_CLINIC_OR_DEPARTMENT_OTHER): Admitting: Family Medicine
